# Patient Record
Sex: Male | Born: 2000 | Race: White | Hispanic: No | Marital: Single | Smoking: Never smoker
Health system: Southern US, Community
[De-identification: ages and names within clinical notes are randomized; demographics above are authoritative.]

## PROBLEM LIST (undated history)

## (undated) DIAGNOSIS — T7840XA Allergy, unspecified, initial encounter: Secondary | ICD-10-CM

## (undated) HISTORY — PX: FRACTURE SURGERY: SHX138

## (undated) HISTORY — DX: Allergy, unspecified, initial encounter: T78.40XA

---
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
## 2021-05-27 ENCOUNTER — Ambulatory Visit (HOSPITAL_BASED_OUTPATIENT_CLINIC_OR_DEPARTMENT_OTHER)
Admission: RE | Admit: 2021-05-27 | Discharge: 2021-05-27 | Disposition: A | Discharge status: Home / Self Care | Payer: 59 | Source: Ambulatory Visit | Attending: Family Medicine | Admitting: Family Medicine

## 2021-05-27 ENCOUNTER — Encounter: Payer: Self-pay | Admitting: Family Medicine

## 2021-05-27 ENCOUNTER — Ambulatory Visit (INDEPENDENT_AMBULATORY_CARE_PROVIDER_SITE_OTHER): Payer: 59 | Admitting: Family Medicine

## 2021-05-27 ENCOUNTER — Other Ambulatory Visit: Payer: Self-pay

## 2021-05-27 VITALS — BP 108/76 | HR 51 | Temp 98.6°F | Ht 60.0 in | Wt 114.2 lb

## 2021-05-27 DIAGNOSIS — M25531 Pain in right wrist: Secondary | ICD-10-CM | POA: Diagnosis present

## 2021-05-27 NOTE — Progress Notes (Signed)
Musculoskeletal Exam  Patient: Kenneth Stein DOB: 2001-09-20  DOS: 05/27/2021  SUBJECTIVE:  Chief Complaint:   Chief Complaint  Patient presents with   Wrist Pain    Right wrist pain for one week Injured playing basketball    Kenneth Stein is a 20 y.o.  male for evaluation and treatment of R wrist pain.   Onset:  1 week ago. Hyperextended wrist  Location: back of R wrist Character:  aching  Progression of issue:  is unchanged Associated symptoms: swelling Treatment: to date has been rest and ice.   Neurovascular symptoms: no  Past Medical History:  Diagnosis Date   Allergy     Objective: VITAL SIGNS: BP 108/76   Pulse (!) 51   Temp 98.6 F (37 C) (Oral)   Ht 5' (1.524 m)   Wt 114 lb 4 oz (51.8 kg)   SpO2 99%   BMI 22.31 kg/m  Constitutional: Well formed, well developed. No acute distress. Thorax & Lungs: No accessory muscle use Musculoskeletal: R wrist.   Normal active range of motion: yes.   Normal passive range of motion: yes Tenderness to palpation: yes over dorsum of wrist medially over pisiform and hamate Deformity: no Ecchymosis: no Neurologic: Normal sensory function in hands Psychiatric: Normal mood. Age appropriate judgment and insight. Alert & oriented x 3.    Assessment:  Right wrist pain - Plan: DG Wrist Complete Right  Plan: Stretches/exercises, heat, ice, Tylenol, ibuprofen. Wrist brace during work w 40 lb lifting restriction for 2 weeks for work.  F/u prn. The patient voiced understanding and agreement to the plan.   Jilda Roche Parsons, DO 05/27/21  2:31 PM

## 2021-05-27 NOTE — Patient Instructions (Addendum)
Ice/cold pack over area for 10-15 min twice daily.  OK to take Tylenol 1000 mg (2 extra strength tabs) or 975 mg (3 regular strength tabs) every 6 hours as needed.  Ibuprofen 400-600 mg (2-3 over the counter strength tabs) every 6 hours as needed for pain.  Wear the brace at night and when you are working.   Let us know if you need anything.  Wrist and Forearm Exercises Do exercises exactly as told by your health care provider and adjust them as directed. It is normal to feel mild stretching, pulling, tightness, or discomfort as you do these exercises, but you should stop right away if you feel sudden pain or your pain gets worse.   RANGE OF MOTION EXERCISES These exercises warm up your muscles and joints and improve the movement and flexibility of your injured wrist and forearm. These exercises also help to relieve pain, numbness, and tingling. These exercises are done using the muscles in your injured wrist and forearm. Exercise A: Wrist Flexion, Active With your fingers relaxed, bend your wrist forward as far as you can. Hold this position for 30 seconds. Repeat 2 times. Complete this exercise 3 times per week. Exercise B: Wrist Extension, Active With your fingers relaxed, bend your wrist backward as far as you can. Hold this position for 30 seconds. Repeat 2 times. Complete this exercise 3 times per week. Exercise C: Supination, Active  Stand or sit with your arms at your sides. Bend your left / right elbow to an "L" shape (90 degrees). Turn your palm upward until you feel a gentle stretch on the inside of your forearm. Hold this position for 30 seconds. Slowly return your palm to the starting position. Repeat 2 times. Complete this exercise 3 times per week. Exercise D: Pronation, Active  Stand or sit with your arms at your sides. Bend your left / right elbow to an "L" shape (90 degrees). Turn your palm downward until you feel a gentle stretch on the top of your forearm. Hold  this position for 30 seconds. Slowly return your palm to the starting position. Repeat 2 times. Complete this exercise once a day.  STRETCHING EXERCISES These exercises warm up your muscles and joints and improve the movement and flexibility of your injured wrist and forearm. These exercises also help to relieve pain, numbness, and tingling. These exercises are done using your healthy wrist and forearm to help stretch the muscles in your injured wrist and forearm. Exercise E: Wrist Flexion, Passive  Extend your left / right arm in front of you, relax your wrist, and point your fingers downward. Gently push on the back of your hand. Stop when you feel a gentle stretch on the top of your forearm. Hold this position for 30 seconds. Repeat 2 times. Complete this exercise 3 times per week. Exercise F: Wrist Extension, Passive  Extend your left / right arm in front of you and turn your palm upward. Gently pull your palm and fingertips back so your fingers point downward. You should feel a gentle stretch on the palm-side of your forearm. Hold this position for 30 seconds. Repeat 2 times. Complete this exercise 3 times per week. Exercise G: Forearm Rotation, Supination, Passive Sit with your left / right elbow bent to an "L" shape (90 degrees) with your forearm resting on a table. Keeping your upper body and shoulder still, use your other hand to rotate your forearm palm-up until you feel a gentle to moderate stretch. Hold this position  for 30 seconds. Slowly release the stretch and return to the starting position. Repeat 2 times. Complete this exercise 3 times per week. Exercise H: Forearm Rotation, Pronation, Passive Sit with your left / right elbow bent to an "L" shape (90 degrees) with your forearm resting on a table. Keeping your upper body and shoulder still, use your other hand to rotate your forearm palm-down until you feel a gentle to moderate stretch. Hold this position for 30  seconds. Slowly release the stretch and return to the starting position. Repeat 2 times. Complete this exercise 3 times per week.  STRENGTHENING EXERCISES These exercises build strength and endurance in your wrist and forearm. Endurance is the ability to use your muscles for a long time, even after they get tired. Exercise I: Wrist Flexors  Sit with your left / right forearm supported on a table and your hand resting palm-up over the edge of the table. Your elbow should be bent to an "L" shape (about 90 degrees) and be below the level of your shoulder. Hold a 3-5 lb weight in your left / right hand. Or, hold a rubber exercise band or tube in both hands, keeping your hands at the same level and hip distance apart. There should be a slight tension in the exercise band or tube. Slowly curl your hand up toward your forearm. Hold this position for 3 seconds. Slowly lower your hand back to the starting position. Repeat 2 times. Complete this exercise 3 times per week. Exercise J: Wrist Extensors  Sit with your left / right forearm supported on a table and your hand resting palm-down over the edge of the table. Your elbow should be bent to an "L" shape (about 90 degrees) and be below the level of your shoulder. Hold a 3-5 lb weight in your left / right hand. Or, hold a rubber exercise band or tube in both hands, keeping your hands at the same level and hip distance apart. There should be a slight tension in the exercise band or tube. Slowly curl your hand up toward your forearm. Hold this position for 3 seconds. Slowly lower your hand back to the starting position. Repeat 2 times. Complete this exercise 3 times per week. Exercise K: Forearm Rotation, Supination  Sit with your left / right forearm supported on a table and your hand resting palm-down. Your elbow should be at your side, bent to an "L" shape (about 90 degrees), and below the level of your shoulder. Keep your wrist stable and in a  neutral position throughout the exercise. Gently hold a lightweight hammer with your left / right hand. Without moving your elbow or wrist, slowly rotate your palm upward to a thumbs-up position. Hold this position for 3 seconds. Slowly return your forearm to the starting position. Repeat 2 times. Complete this exercise 3 times per week. Exercise L: Forearm Rotation, Pronation  Sit with your left / right forearm supported on a table and your hand resting palm-up. Your elbow should be at your side, bent to an "L" shape (about 90 degrees), and below the level of your shoulder. Keep your wrist stable. Do not allow it to move backward or forward during the exercise. Gently hold a lightweight hammer with your left / right hand. Without moving your elbow or wrist, slowly rotate your palm and hand upward to a thumbs-up position. Hold this position for 3 seconds. Slowly return your forearm to the starting position. Repeat 2 times. Complete this exercise 3 times per  week. Exercise M: Grip Strengthening  Hold one of these items in your left / right hand: play dough, therapy putty, a dense sponge, a stress ball, or a large, rolled sock. Squeeze as hard as you can without increasing pain. Hold this position for 5 seconds. Slowly release your grip. Repeat 2 times. Complete this exercise 3 times per week.  This information is not intended to replace advice given to you by your health care provider. Make sure you discuss any questions you have with your health care provider. Document Released: 07/29/2005 Document Revised: 06/08/2016 Document Reviewed: 06/09/2015 Elsevier Interactive Patient Education  [employer].

## 2021-06-12 ENCOUNTER — Telehealth: Payer: Self-pay | Admitting: Family Medicine

## 2021-06-12 NOTE — Telephone Encounter (Signed)
Patient called stating his sprained wrist (per his last visit 08/30) is crooked and he cannot keep it straight. He wants to know what he can do. Please advice.

## 2021-06-12 NOTE — Telephone Encounter (Signed)
It's crooked? Did he further injure it? Could refer to sports med. Please place referral if he is interested. Ty.

## 2021-06-13 ENCOUNTER — Other Ambulatory Visit: Payer: Self-pay | Admitting: Family Medicine

## 2021-06-13 DIAGNOSIS — M25531 Pain in right wrist: Secondary | ICD-10-CM

## 2021-06-13 NOTE — Telephone Encounter (Signed)
Referral done

## 2021-06-13 NOTE — Telephone Encounter (Signed)
Called left message to call back 

## 2021-06-13 NOTE — Telephone Encounter (Signed)
Called home/cell number left message to call back.

## 2021-06-13 NOTE — Telephone Encounter (Signed)
Spoke to the patient and he said he has not injured it and does not hurt/but he cannot straighten his arm A referral to either PT or sports medicine either is fine but refer where PCP thinks best.

## 2021-06-19 ENCOUNTER — Ambulatory Visit: Payer: Self-pay

## 2021-06-19 ENCOUNTER — Other Ambulatory Visit: Payer: Self-pay

## 2021-06-19 ENCOUNTER — Encounter: Payer: Self-pay | Admitting: Family Medicine

## 2021-06-19 ENCOUNTER — Ambulatory Visit (INDEPENDENT_AMBULATORY_CARE_PROVIDER_SITE_OTHER): Payer: 59 | Admitting: Family Medicine

## 2021-06-19 VITALS — Ht 60.0 in | Wt 118.0 lb

## 2021-06-19 DIAGNOSIS — S63591A Other specified sprain of right wrist, initial encounter: Secondary | ICD-10-CM

## 2021-06-19 DIAGNOSIS — M25531 Pain in right wrist: Secondary | ICD-10-CM

## 2021-06-19 MED ORDER — PREDNISONE 5 MG PO TABS: ORAL_TABLET | ORAL | 0 refills | Status: DC

## 2021-06-19 NOTE — Patient Instructions (Signed)
Nice to meet you Please try ice as needed  You can try the wraps  Please try the exercises   Please send me a message in MyChart with any questions or updates.  Please see me back in 3 weeks.   --Dr. Jordan Likes

## 2021-06-19 NOTE — Progress Notes (Signed)
  Kenneth Stein - 20 y.o. male MRN [id number]  Date of birth: 10-19-2000  SUBJECTIVE:  Including CC & ROS.  No chief complaint on file.   Kenneth Stein is a 20 y.o. male that is presenting with right hand pain.  He was playing basketball and had a hyperextension injury.  Since that time has had pain for the ulnar aspect of the wrist.  Independent review of the right wrist x-ray from 8/30 shows no acute changes.   Review of Systems See HPI   HISTORY: Past Medical, Surgical, Social, and Family History Reviewed & Updated per EMR.   Pertinent Historical Findings include:  Past Medical History:  Diagnosis Date   Allergy     Past Surgical History:  Procedure Laterality Date   FRACTURE SURGERY      Family History  Problem Relation Age of Onset   Hyperlipidemia Father     Social History   Socioeconomic History   Marital status: Single    Spouse name: Not on file   Number of children: Not on file   Years of education: Not on file   Highest education level: Not on file  Occupational History   Not on file  Tobacco Use   Smoking status: Never   Smokeless tobacco: Never  Substance and Sexual Activity   Alcohol use: No    Alcohol/week: 0.0 standard drinks   Drug use: No   Sexual activity: Not on file  Other Topics Concern   Not on file  Social History Narrative   Not on file   Social Determinants of Health   Financial Resource Strain: Not on file  Food Insecurity: Not on file  Transportation Needs: Not on file  Physical Activity: Not on file  Stress: Not on file  Social Connections: Not on file  Intimate Partner Violence: Not on file     PHYSICAL EXAM:  VS: Ht 5' (1.524 m)   Wt 118 lb (53.5 kg)   BMI 23.05 kg/m  Physical Exam Gen: NAD, alert, cooperative with exam, well-appearing   Limited ultrasound: Right wrist:  No effusion appreciated of the distal radius. Increased hyperemia distal to the distal ulna.  There does appear to be some fluid in this  area. No ECU changes. Normal-appearing dorsal compartments.   Summary: Findings consistent with DRUJ sprain   Ultrasound and interpretation by Clare Gandy, MD     ASSESSMENT & PLAN:   Sprain of right distal radioulnar joint Injury occurred on 8/23.  Appears to have change of the DRUJ with extension injury  -Counseled on home exercise therapy and supportive care. -Counseled on wrist wraps. -Prednisone. -Could consider therapy.

## 2021-06-20 DIAGNOSIS — S63591A Other specified sprain of right wrist, initial encounter: Secondary | ICD-10-CM | POA: Insufficient documentation

## 2021-06-20 NOTE — Assessment & Plan Note (Signed)
Injury occurred on 8/23.  Appears to have change of the DRUJ with extension injury  -Counseled on home exercise therapy and supportive care. -Counseled on wrist wraps. -Prednisone. -Could consider therapy.

## 2021-07-10 ENCOUNTER — Ambulatory Visit: Payer: 59 | Admitting: Family Medicine

## 2021-07-14 ENCOUNTER — Ambulatory Visit: Payer: 59 | Admitting: Family Medicine

## 2021-07-14 NOTE — Progress Notes (Deleted)
  Kenneth Stein - 20 y.o. male MRN [id number]  Date of birth: August 10, 2001  SUBJECTIVE:  Including CC & ROS.  No chief complaint on file.   Kenneth Stein is a 20 y.o. male that is  ***.  ***   Review of Systems See HPI   HISTORY: Past Medical, Surgical, Social, and Family History Reviewed & Updated per EMR.   Pertinent Historical Findings include:  Past Medical History:  Diagnosis Date   Allergy     Past Surgical History:  Procedure Laterality Date   FRACTURE SURGERY      Family History  Problem Relation Age of Onset   Hyperlipidemia Father     Social History   Socioeconomic History   Marital status: Single    Spouse name: Not on file   Number of children: Not on file   Years of education: Not on file   Highest education level: Not on file  Occupational History   Not on file  Tobacco Use   Smoking status: Never   Smokeless tobacco: Never  Substance and Sexual Activity   Alcohol use: No    Alcohol/week: 0.0 standard drinks   Drug use: No   Sexual activity: Not on file  Other Topics Concern   Not on file  Social History Narrative   Not on file   Social Determinants of Health   Financial Resource Strain: Not on file  Food Insecurity: Not on file  Transportation Needs: Not on file  Physical Activity: Not on file  Stress: Not on file  Social Connections: Not on file  Intimate Partner Violence: Not on file     PHYSICAL EXAM:  VS: There were no vitals taken for this visit. Physical Exam Gen: NAD, alert, cooperative with exam, well-appearing MSK:  ***      ASSESSMENT & PLAN:   No problem-specific Assessment & Plan notes found for this encounter.

## 2022-03-04 ENCOUNTER — Encounter: Payer: Self-pay | Admitting: Family Medicine

## 2022-03-04 ENCOUNTER — Ambulatory Visit: Payer: 59 | Admitting: Family Medicine

## 2022-03-04 VITALS — BP 110/78 | HR 73 | Temp 98.1°F | Ht 60.0 in | Wt 118.2 lb

## 2022-03-04 DIAGNOSIS — M25462 Effusion, left knee: Secondary | ICD-10-CM

## 2022-03-04 DIAGNOSIS — S76112A Strain of left quadriceps muscle, fascia and tendon, initial encounter: Secondary | ICD-10-CM

## 2022-03-04 MED ORDER — PREDNISONE 20 MG PO TABS: 40.0000 mg | ORAL_TABLET | Freq: Every day | ORAL | 0 refills | Status: AC

## 2022-03-04 NOTE — Progress Notes (Signed)
Musculoskeletal Exam  Patient: Kenneth Stein DOB: 18-Aug-2001  DOS: 03/04/2022  SUBJECTIVE:  Chief Complaint:   Chief Complaint  Patient presents with   Knee Pain    Left     Kenneth Stein is a 21 y.o.  male for evaluation and treatment of L knee pain.   Onset:  5 days ago. No inj or change in activity.  Location: medial knee Character:  aching and sharp  Progression of issue:  is unchanged Associated symptoms: swelling No redness, bruising, catching, locking, fevers Treatment: to date has been ice and elevation.   Neurovascular symptoms: no  Past Medical History:  Diagnosis Date   Allergy     Objective: VITAL SIGNS: BP 110/78   Pulse 73   Temp 98.1 F (36.7 C) (Oral)   Ht 5' (1.524 m)   Wt 118 lb 4 oz (53.6 kg)   SpO2 99%   BMI 23.09 kg/m  Constitutional: Well formed, well developed. No acute distress. Thorax & Lungs: No accessory muscle use Musculoskeletal: L knee.   Normal active range of motion: Decreased flexion, normal extension Normal passive range of motion: Decreased flexion, normal extension Tenderness to palpation: yes over the lVL (no ttp over VM) and middle belly overall of quad Deformity: edema noted over VM distally and a small effusion of the knee joint Ecchymosis: no Pain elicited w resisted knee extension Tests positive: Stinchfield Tests negative: Lachman's, varus/valgus stress, patellar apprehension/grind,, equivocal McMurray's due to poor range of motion, negative Stines Neurologic: Normal sensory function. No focal deficits noted.  Antalgic gait Psychiatric: Normal mood. Age appropriate judgment and insight. Alert & oriented x 3.    Assessment:  Effusion of left knee - Plan: Sedimentation rate, Uric acid  Strain of left quadriceps muscle, initial encounter - Plan: Sedimentation rate, CBC  Plan: Stretches/exercises with quad, heat, ice, Tylenol, elevation, 5-day prednisone burst 40 mg daily.  We will check labs to rule out gout and  possible pyomyositis. F/u prn. The patient voiced understanding and agreement to the plan.   [name], DO 03/04/22  4:20 PM

## 2022-03-04 NOTE — Patient Instructions (Signed)
Give [place] 2-3 business days to get the results of your labs back.   Ice/cold pack over area for 10-15 min twice daily.  OK to take Tylenol 1000 mg (2 extra strength tabs) or 975 mg (3 regular strength tabs) every 6 hours as needed.  Let us know if you need anything.  Quadriceps Strain Rehab It is normal to feel mild stretching, pulling, tightness, or discomfort as you do these exercises, but you should stop right away if you feel sudden pain or your pain gets worse. Stretching and range of motion exercises These exercises warm up your muscles and joints and improve the movement and flexibility of your thigh. These exercises can also help to relieve stiffness or swelling. Exercise A: Heel slides    Lie on your back with both knees straight. If this causes back discomfort, bend the knee of your healthy leg, placing your foot flat on the floor. Slowly slide your left / right heel back toward your buttocks until you feel a gentle stretch in the front of your knee or thigh. Hold for 30 seconds. Then slowly slide your heel back to the starting position. Repeat 2 times. Complete this exercise 3 times a week. Exercise B: Quadriceps stretch, prone    Lie on your abdomen on a firm surface, such as a bed or padded floor. Bend your left / right knee and hold your ankle. If you cannot reach your ankle or pant leg, loop a belt around your foot and grab the belt instead. Gently pull your heel toward your buttocks. Your knee should not slide out to the side. You should feel a stretch in the front of your thigh and knee. Hold this position for 30 seconds. Repeat 2 times. Complete this exercise 3 times a week. Strengthening exercises These exercises build strength and endurance in your thigh. Endurance is the ability to use your muscles for a long time, even after your muscles get tired. Exercise C: Straight leg raises (quadriceps and hip flexors) Quality counts! Watch for signs that the quadriceps muscle  is working to ensure that you are strengthening the correct muscles and not cheating by using healthier muscles. Lie on your back with your left / right leg extended and your other knee bent. Tense the muscles in the front of your left / right thigh. You should see your kneecap slide up or see increased dimpling just above the knee. Tighten these muscles even more and raise your leg 4-6 inches (10-15 cm) off the floor. Hold for 3 seconds. Keep the thigh muscles tense as you lower your leg. Relax the muscles slowly and completely after each repetition. Repeat 2 times. Complete this exercise 3 times a week. Exercise D: Straight leg raises (hip extensors) Lie on your belly on a bed or a firm surface with a pillow under your hips. Bend your left / right knee so your foot is straight up in the air. Tense your buttock muscles and lift your left / right thigh off the bed. Do not let your back arch. Hold this position for 3 seconds. Slowly return to the starting position. Let your muscles relax completely before doing another repetition. Repeat 2 times. Complete this exercise 3 times a week. Exercise E: Wall sits    Follow the directions for form closely. If you do not place your feet and knees properly, this can lead to knee pain. Lean back against a smooth wall or door and walk your feet out 18-24 inches (46-61 cm) from  it. Place your feet hip-width apart. Slowly slide down the wall or door until your knees bend  60-90 degrees. Keep your weight back and over your heels, not over your toes. Keep your thighs straight or pointing slightly outward. Hold for 1 second. Use your thigh and buttock muscles to push you back up to a standing position. Keep your weight through your heels while you do this. Rest for 5 seconds in between repetitions. Repeat 2 times. Complete this exercise 3 times a week. Make sure you discuss any questions you have with your health care provider. Document Released: 09/14/2005  Document Revised: 05/21/2016 Document Reviewed: 06/18/2015 Elsevier Interactive Patient Education  [employer].

## 2022-03-05 LAB — CBC
HCT: 43.4 % (ref 39.0–52.0)
Hemoglobin: 14.7 g/dL (ref 13.0–17.0)
MCHC: 33.8 g/dL (ref 30.0–36.0)
MCV: 89.7 fl (ref 78.0–100.0)
Platelets: 378 10*3/uL (ref 150.0–400.0)
RBC: 4.84 Mil/uL (ref 4.22–5.81)
RDW: 13.1 % (ref 11.5–14.6)
WBC: 11 10*3/uL — ABNORMAL HIGH (ref 4.5–10.5)

## 2022-03-05 LAB — SEDIMENTATION RATE: Sed Rate: 16 mm/hr — ABNORMAL HIGH (ref 0–15)

## 2022-03-05 LAB — URIC ACID: Uric Acid, Serum: 5.3 mg/dL (ref 4.0–7.8)

## 2022-03-11 ENCOUNTER — Emergency Department (HOSPITAL_COMMUNITY)
Admission: EM | Admit: 2022-03-11 | Discharge: 2022-03-11 | Disposition: A | Discharge status: Home / Self Care | Payer: 59 | Attending: Emergency Medicine | Admitting: Emergency Medicine

## 2022-03-11 ENCOUNTER — Emergency Department (HOSPITAL_COMMUNITY): Payer: 59

## 2022-03-11 ENCOUNTER — Other Ambulatory Visit: Payer: Self-pay

## 2022-03-11 ENCOUNTER — Encounter (HOSPITAL_COMMUNITY): Payer: Self-pay

## 2022-03-11 DIAGNOSIS — M1990 Unspecified osteoarthritis, unspecified site: Secondary | ICD-10-CM

## 2022-03-11 DIAGNOSIS — M064 Inflammatory polyarthropathy: Secondary | ICD-10-CM | POA: Insufficient documentation

## 2022-03-11 DIAGNOSIS — M1380 Other specified arthritis, unspecified site: Secondary | ICD-10-CM

## 2022-03-11 DIAGNOSIS — H9202 Otalgia, left ear: Secondary | ICD-10-CM | POA: Diagnosis not present

## 2022-03-11 DIAGNOSIS — M25562 Pain in left knee: Secondary | ICD-10-CM | POA: Diagnosis present

## 2022-03-11 LAB — SYNOVIAL CELL COUNT + DIFF, W/ CRYSTALS
Crystals, Fluid: NONE SEEN
Eosinophils-Synovial: 0 % (ref 0–1)
Lymphocytes-Synovial Fld: 30 % — ABNORMAL HIGH (ref 0–20)
Monocyte-Macrophage-Synovial Fluid: 3 % — ABNORMAL LOW (ref 50–90)
Neutrophil, Synovial: 67 % — ABNORMAL HIGH (ref 0–25)
WBC, Synovial: 10875 /mm3 — ABNORMAL HIGH (ref 0–200)

## 2022-03-11 MED ORDER — ONDANSETRON 4 MG PO TBDP
ORAL_TABLET | ORAL | Status: AC
  Administered 2022-03-11: 4 mg via ORAL
  Filled 2022-03-11: qty 1

## 2022-03-11 MED ORDER — OXYCODONE-ACETAMINOPHEN 5-325 MG PO TABS
1.0000 | ORAL_TABLET | Freq: Once | ORAL | Status: AC
  Administered 2022-03-11: 1 via ORAL
  Filled 2022-03-11: qty 1

## 2022-03-11 MED ORDER — ONDANSETRON 4 MG PO TBDP: 4.0000 mg | ORAL_TABLET | Freq: Once | ORAL | Status: AC

## 2022-03-11 MED ORDER — LIDOCAINE-EPINEPHRINE (PF) 2 %-1:200000 IJ SOLN
10.0000 mL | Freq: Once | INTRAMUSCULAR | Status: AC
  Administered 2022-03-11: 10 mL via INTRADERMAL
  Filled 2022-03-11: qty 20

## 2022-03-11 NOTE — ED Triage Notes (Signed)
Pt having left knee pain for two weeks and has been on steroid from pcp for 5 days with no improvement. Pt ambulatory to triage. 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

## 2022-03-11 NOTE — ED Notes (Signed)
Patient Alert and oriented to baseline. Stable and ambulatory to baseline. Patient verbalized understanding of the discharge instructions.  Patient belongings were taken by the patient.   

## 2022-03-11 NOTE — ED Notes (Signed)
Pt c/o nausea, vomiting after taking pain medication. Verbal order from [facility] MD for ODT Zofran

## 2022-03-11 NOTE — Discharge Instructions (Signed)
Continue ibuprofen every 6 hours.  Utilize Ace wrap or knee sleeve for comfort.  Continue to put weight on your left leg within the tolerance of your pain.  Avoid activities that worsen your pain.  Follow-up with your primary care doctor as needed and return to the emergency department at any time for any new or worsening symptoms.

## 2022-03-11 NOTE — ED Provider Notes (Addendum)
[facility] EMERGENCY DEPARTMENT Provider Note   CSN: [id number] Arrival date & time: 03/11/22  7829     History  Chief Complaint  Patient presents with   Knee Pain    Kenneth Stein is a 21 y.o. male.   Knee Pain Patient presents for knee pain.  Pain has been present for the past 2 weeks.  At the time of onset, he was quite active.  He will play basketball often.  He developed soreness to his left knee.  Approximately 1 week ago, he was at work when he felt a sensation of his knee locking up.  This caused worsening pain.  For work, patient does Patent examiner.  The worsening pain did prompt him to go to his primary care doctor.  His PCP check lab work and patient was given 5 days of a steroid course.  He does not feel that this has helped his pain.  He has been trying to elevate his knee at home.  He feels that the pain worsens when he props it up on the pillows.  Pain became more severe overnight.  He has more difficult time putting weight on his knee today.  He did take 3 tablets of ibuprofen this morning which did help.  He has not been utilizing any brace or crutches.  Prior to the onset of his symptoms, patient did develop URI symptoms.  He thinks this may have been from weather and environment changes following a trip to [place].  He was treated for left ear otitis.  He has had improved left ear pain since that time. He denies any recent symptoms of STI.  Since his URI, he has not had any systemic symptoms.  He currently endorses knee pain only.     Home Medications Prior to Admission medications   Medication Sig Start Date End Date Taking? Authorizing Provider  acetaminophen (TYLENOL) 500 MG tablet Take 1,000 mg by mouth every 6 (six) hours as needed for mild pain.   Yes [provider]  ibuprofen (ADVIL) 200 MG tablet Take 400 mg by mouth every 6 (six) hours as needed for mild pain.   Yes [provider]  ofloxacin (FLOXIN) 0.3 % OTIC solution  5 drops 2 (two) times daily. 02/27/22  Yes [provider]  amoxicillin-clavulanate (AUGMENTIN) 875-125 MG tablet Take 1 tablet by mouth 2 (two) times daily. Patient not taking: Reported on 03/11/2022 02/27/22   [provider]  predniSONE (DELTASONE) 20 MG tablet Take 20 mg by mouth 2 (two) times daily. 5 day course. Finished 03/09/22 Patient not taking: Reported on 03/11/2022    [provider]      Allergies    Patient has no known allergies.    Review of Systems   Review of Systems  HENT:  Positive for ear pain (Left ear, resolving).   Musculoskeletal:  Positive for arthralgias and joint swelling.  All other systems reviewed and are negative.   Physical Exam Updated Vital Signs BP 126/61   Pulse 66   Temp 98.5 F (36.9 C) (Oral)   Resp 16   Ht 5' (1.524 m)   Wt 53.6 kg   SpO2 95%   BMI 23.08 kg/m  Physical Exam Vitals and nursing note reviewed.  Constitutional:      General: He is not in acute distress.    Appearance: Normal appearance. He is well-developed and normal weight. He is not ill-appearing, toxic-appearing or diaphoretic.  HENT:     Head:  Normocephalic and atraumatic.     Right Ear: Tympanic membrane, ear canal and external ear normal.     Left Ear: External ear normal.     Ears:     Comments: Resolving bullous myringitis of left TM    Nose: Nose normal.     Mouth/Throat:     Mouth: Mucous membranes are moist.     Pharynx: Oropharynx is clear.  Eyes:     Extraocular Movements: Extraocular movements intact.     Conjunctiva/sclera: Conjunctivae normal.  Cardiovascular:     Rate and Rhythm: Normal rate.  Pulmonary:     Effort: Pulmonary effort is normal. No respiratory distress.  Abdominal:     General: There is no distension.     Palpations: Abdomen is soft.  Musculoskeletal:        General: Swelling and tenderness present. No deformity.     Cervical back: Normal range of motion and neck supple.     Right lower leg: No edema.      Left lower leg: No edema.  Skin:    General: Skin is warm and dry.     Capillary Refill: Capillary refill takes less than 2 seconds.     Coloration: Skin is not jaundiced or pale.     Findings: No erythema.  Neurological:     General: No focal deficit present.     Mental Status: He is alert and oriented to person, place, and time.     Cranial Nerves: No cranial nerve deficit.     Sensory: No sensory deficit.     Motor: No weakness.     Coordination: Coordination normal.  Psychiatric:        Mood and Affect: Mood normal.        Behavior: Behavior normal.        Thought Content: Thought content normal.        Judgment: Judgment normal.     ED Results / Procedures / Treatments   Labs (all labs ordered are listed, but only abnormal results are displayed) Labs Reviewed  SYNOVIAL CELL COUNT + DIFF, W/ CRYSTALS - Abnormal; Notable for the following components:      Result Value   Appearance-Synovial CLOUDY (*)    WBC, Synovial 10,875 (*)    Neutrophil, Synovial 67 (*)    Lymphocytes-Synovial Fld 30 (*)    Monocyte-Macrophage-Synovial Fluid 3 (*)    All other components within normal limits  BODY FLUID CULTURE W GRAM STAIN  GLUCOSE, BODY FLUID OTHER              EKG None  Radiology DG Knee Complete 4 Views Left  Result Date: 03/11/2022 CLINICAL DATA:  21 year old male with history of left knee pain. EXAM: LEFT KNEE - COMPLETE 4+ VIEW COMPARISON:  No priors. FINDINGS: No evidence of fracture, dislocation, or joint effusion. No evidence of arthropathy or other focal bone abnormality. Soft tissues are unremarkable. IMPRESSION: Negative. Electronically Signed   By: Trudie Reedaniel  Entrikin M.D.   On: 03/11/2022 07:39    Procedures .Joint Aspiration/Arthrocentesis  Date/Time: 03/11/2022 10:58 AM  Performed by: Gloris Manchesterixon, Kabrina Christiano, MD Authorized by: Gloris Manchesterixon, Denee Boeder, MD   Consent:    Consent obtained:  Verbal   Consent given by:  Patient   Risks, benefits, and alternatives were discussed: yes      Risks discussed:  Bleeding, infection and pain   Alternatives discussed:  No treatment and delayed treatment Universal protocol:    Procedure explained and questions answered to patient or proxy's  satisfaction: yes     Imaging studies available: yes     Patient identity confirmed:  Verbally with patient Location:    Location:  Knee Anesthesia:    Anesthesia method:  Local infiltration   Local anesthetic:  Lidocaine 2% WITH epi Procedure details:    Preparation: Patient was prepped and draped in usual sterile fashion     Needle gauge:  18 G   Ultrasound guidance: yes     Approach:  Superior   Aspirate amount:  10cc   Aspirate characteristics:  Yellow   Steroid injected: no   Post-procedure details:    Dressing:  Adhesive bandage and gauze roll   Procedure completion:  Tolerated     Medications Ordered in ED Medications  oxyCODONE-acetaminophen (PERCOCET/ROXICET) 5-325 MG per tablet 1 tablet (1 tablet Oral Given 03/11/22 1039)  lidocaine-EPINEPHrine (XYLOCAINE W/EPI) 2 %-1:200000 (PF) injection 10 mL (10 mLs Intradermal Given 03/11/22 1039)  ondansetron (ZOFRAN-ODT) disintegrating tablet 4 mg (4 mg Oral Given 03/11/22 1209)    ED Course/ Medical Decision Making/ A&P                           Medical Decision Making Amount and/or Complexity of Data Reviewed Labs: ordered.  Risk Prescription drug management.   This patient presents to the ED for concern of left knee pain, this involves an extensive number of treatment options, and is a complaint that carries with it a high risk of complications and morbidity.  The differential diagnosis includes soft tissue injury, occult fracture, inflammatory arthritis, gout, septic joint, reactive streptococcal arthritis   Co morbidities that complicate the patient evaluation  N/A   Additional history obtained:  Additional history obtained from N/A External records from outside source obtained and reviewed including EMR   Lab  Tests:  I Ordered, and personally interpreted labs.  The pertinent results include: No crystals and 10,875 WBCs on synovial fluid analysis, consistent with inflammatory arthritis   Imaging Studies ordered:  I ordered imaging studies including knee x-ray I independently visualized and interpreted imaging which showed no osseous or joint abnormalities I agree with the radiologist interpretation   Problem List / ED Course / Critical interventions / Medication management  Patient is a healthy 21 year old male presenting for left knee pain.  This pain has been ongoing for the past 2 weeks.  He believes it was initially due to overuse injury from being very active and playing basketball.  1 week ago, he had a worsening while at work due to a abnormal twisting of his knee.  He underwent 5-day steroid course without any relief.  Pain worsened overnight and for this reason, patient presents to the ED.  On arrival, vital signs are normal.  Patient is well-appearing.  Left knee shows mild swelling and tenderness that is greatest in the quadriceps tendon region.  Knee is mildly warm to touch when compared to right knee.  Patient is active range of motion is limited by pain.  Passive range of motion is intact.  Patient is able to bear very light weight on his left knee.  He did take ibuprofen this morning.  Patient consented for arthrocentesis.  Percocet was given for additional analgesia.  Patient does have history of vasovagal syncope with blood draws.  During arthrocentesis, he did have a syncopal episode with vasovagal prodrome.  Syncope lasted for 5 seconds.  He returned to baseline quickly thereafter.  Otherwise, he tolerated procedure well.  He was  given ice water to drink.  He was kept in his chair.  Synovial fluid was sent for testing.  Results of synovial fluid testing are consistent with inflammatory arthritis.  Patient was provided Ace wrap as well as knee sleeve.  He declined crutches.  He was advised  to weight-bear as tolerated and to continue ibuprofen every 6 hours.  He was discharged in good condition. I ordered medication including Percocet for analgesia Reevaluation of the patient after these medicines showed that the patient improved I have reviewed the patients home medicines and have made adjustments as needed   Social Determinants of Health:  Has PCP        Final Clinical Impression(s) / ED Diagnoses Final diagnoses:  Inflammatory arthritis    Rx / DC Orders ED Discharge Orders     None         Gloris Manchester, MD 03/11/22 1334    Gloris Manchester, MD 03/11/22 1335

## 2022-03-12 LAB — GLUCOSE, BODY FLUID OTHER: Glucose, Body Fluid Other: 43 mg/dL

## 2022-03-14 LAB — BODY FLUID CULTURE W GRAM STAIN: Culture: NO GROWTH

## 2022-12-10 ENCOUNTER — Ambulatory Visit: Payer: 59 | Admitting: Family Medicine

## 2022-12-14 ENCOUNTER — Ambulatory Visit: Payer: No Typology Code available for payment source | Admitting: Family Medicine

## 2022-12-14 ENCOUNTER — Encounter: Payer: Self-pay | Admitting: Family Medicine

## 2022-12-14 VITALS — BP 108/60 | HR 69 | Temp 98.8°F | Ht 61.0 in | Wt 115.4 lb

## 2022-12-14 DIAGNOSIS — J020 Streptococcal pharyngitis: Secondary | ICD-10-CM | POA: Diagnosis not present

## 2022-12-14 LAB — POCT RAPID STREP A (OFFICE): Rapid Strep A Screen: POSITIVE — AB

## 2022-12-14 MED ORDER — AMOXICILLIN 500 MG PO CAPS: 1000.0000 mg | ORAL_CAPSULE | Freq: Every day | ORAL | 0 refills | Status: AC

## 2022-12-14 NOTE — Progress Notes (Signed)
Chief Complaint  Patient presents with   Sore Throat    Spot on finger to check     Kenneth Stein here for URI complaints.  Duration: 2 days  Associated symptoms: sore throat Denies: sinus congestion, sinus pain, rhinorrhea, itchy watery eyes, ear pain, ear drainage, wheezing, shortness of breath, myalgia, coughing, and fevers Treatment to date: Tylenol, ibuprofen Sick contacts: No  Past Medical History:  Diagnosis Date   Allergy     Objective BP 108/60 (BP Location: Left Arm, Patient Position: Sitting, Cuff Size: Normal)   Pulse 69   Temp 98.8 F (37.1 C) (Oral)   Ht 5\' 1"  (1.549 m)   Wt 115 lb 6 oz (52.3 kg)   SpO2 99%   BMI 21.80 kg/m  General: Awake, alert, appears stated age HEENT: AT, [place], ears patent b/l and TM's neg, nares patent w/o discharge, pharynx erythematous and without exudates, MMM Neck: No masses or asymmetry, tender cerv LN's b/l Heart: RRR Lungs: CTAB, no accessory muscle use Psych: Age appropriate judgment and insight, normal mood and affect  Strep throat - Plan: POCT rapid strep A, amoxicillin (AMOXIL) 500 MG capsule  2/4 Centor criteria. Strep test +. 10 d of amox. Ibuprofen/Tylenol. Continue to push fluids, practice good hand hygiene, cover mouth when coughing. F/u prn. If starting to experience fevers, shaking, or shortness of breath, seek immediate care. Pt voiced understanding and agreement to the plan.  [place], DO 12/14/22 3:38 PM

## 2022-12-14 NOTE — Patient Instructions (Signed)
Consider throat lozenges, salt water gargles and an air humidifier for symptomatic care.   Replace your toothbrush/head after 24 hrs on antibiotics.  OK to take Tylenol 1000 mg (2 extra strength tabs) or 975 mg (3 regular strength tabs) every 6 hours as needed.  Ibuprofen 400-600 mg (2-3 over the counter strength tabs) every 6 hours as needed for pain.  Let us know if you need anything.

## 2023-01-13 ENCOUNTER — Encounter: Payer: Self-pay | Admitting: *Deleted

## 2023-04-12 ENCOUNTER — Encounter: Payer: Self-pay | Admitting: Family Medicine

## 2023-04-12 ENCOUNTER — Ambulatory Visit: Payer: No Typology Code available for payment source | Admitting: Family Medicine

## 2023-04-12 VITALS — BP 120/80 | HR 51 | Temp 98.5°F | Ht 61.0 in | Wt 116.5 lb

## 2023-04-12 DIAGNOSIS — L255 Unspecified contact dermatitis due to plants, except food: Secondary | ICD-10-CM

## 2023-04-12 MED ORDER — PREDNISONE 20 MG PO TABS: ORAL_TABLET | ORAL | 0 refills | Status: DC

## 2023-04-12 NOTE — Patient Instructions (Addendum)
Try not to scratch as this can make things worse. Avoid scented products while dealing with this. You may resume when the itchiness resolves. Cold/cool compresses can help.   Claritin (loratadine), Allegra (fexofenadine), Zyrtec (cetirizine) which is also equivalent to Xyzal (levocetirizine); these are listed in order from weakest to strongest. Generic, and therefore cheaper, options are in the parentheses.   There are available OTC, and the generic versions, which may be cheaper, are in parentheses. Show this to a pharmacist if you have trouble finding any of these items.  Let us know if you need anything.

## 2023-04-12 NOTE — Progress Notes (Signed)
Chief Complaint  Patient presents with   Poison Ivy    Spero Gunnels is a 22 y.o. male here for a poison ivy exposure.  Duration: 4 days Location: legs, forearms Pruritic? Yes Painful? No Drainage? No +exposure to poison ivy Other associated symptoms: feels it is spreading, no fevers Therapies tried thus far: OTC topicals  Past Medical History:  Diagnosis Date   Allergy     BP 120/80 (BP Location: Left Arm, Patient Position: Sitting, Cuff Size: Normal)   Pulse (!) 51   Temp 98.5 F (36.9 C) (Oral)   Ht 5\' 1"  (1.549 m)   Wt 116 lb 8 oz (52.8 kg)   SpO2 99%   BMI 22.01 kg/m  Gen: awake, alert, appearing stated age Lungs: No accessory muscle use Skin: See below. No drainage, excessive warmth, ttp Psych: Age appropriate judgment and insight         Rhus dermatitis - Plan: predniSONE (DELTASONE) 20 MG tablet  3 week taper of 60 mg/d for 1 weeks, 40 mg/d for 1 week and 20 mg/d for final week. Try not to scratch. Avoid scented products. Consider PO antihist.  F/u prn. The patient voiced understanding and agreement to the plan.  Jilda Roche [place], DO 04/12/23 11:57 AM

## 2023-04-13 ENCOUNTER — Telehealth: Payer: Self-pay | Admitting: Family Medicine

## 2023-04-13 ENCOUNTER — Ambulatory Visit (INDEPENDENT_AMBULATORY_CARE_PROVIDER_SITE_OTHER): Payer: No Typology Code available for payment source

## 2023-04-13 DIAGNOSIS — L255 Unspecified contact dermatitis due to plants, except food: Secondary | ICD-10-CM | POA: Diagnosis not present

## 2023-04-13 MED ORDER — METHYLPREDNISOLONE ACETATE 80 MG/ML IJ SUSP
80.0000 mg | Freq: Once | INTRAMUSCULAR | Status: AC
  Administered 2023-04-13: 80 mg via INTRAMUSCULAR

## 2023-04-13 MED ORDER — CALAMINE EX LOTN: 1.0000 | TOPICAL_LOTION | CUTANEOUS | Status: DC | PRN

## 2023-04-13 NOTE — Progress Notes (Signed)
Kenneth Stein is a 22 y.o. male presents to the office today for Depo 80 mg injections, per physician's orders. Original order: 04/13/23: "Could come in for a depomedrol shot but if he started medicine yesterday, it probably just needs some more time for it to kick in." Depo 80 mg ,  IM was administered R Ventral gluteal today. Patient tolerated injection. Patient due for follow up labs/provider appt: No.  Patient next injection due: n/a  Pt asked what he could use topically for his sxs:  Advised OTC Calamine lotion as additional topical tx for rash.  Pt aware and voices understanding.   [place], Feliberto Harts

## 2023-04-13 NOTE — Telephone Encounter (Signed)
Pt called stating his Poison [place] is getting worse and the medication is not helping. Please Advise.

## 2023-04-13 NOTE — Telephone Encounter (Signed)
Could come in for a depomedrol shot but if he started medicine yesterday, it probably just needs some more time for it to kick in.

## 2023-04-13 NOTE — Telephone Encounter (Signed)
Called and scheduled NV today.

## 2023-04-20 ENCOUNTER — Emergency Department (HOSPITAL_BASED_OUTPATIENT_CLINIC_OR_DEPARTMENT_OTHER)
Admission: EM | Admit: 2023-04-20 | Discharge: 2023-04-20 | Disposition: A | Discharge status: Home / Self Care | Payer: 59 | Attending: Emergency Medicine | Admitting: Emergency Medicine

## 2023-04-20 ENCOUNTER — Emergency Department (HOSPITAL_BASED_OUTPATIENT_CLINIC_OR_DEPARTMENT_OTHER): Payer: 59

## 2023-04-20 ENCOUNTER — Encounter (HOSPITAL_BASED_OUTPATIENT_CLINIC_OR_DEPARTMENT_OTHER): Payer: Self-pay | Admitting: Emergency Medicine

## 2023-04-20 ENCOUNTER — Other Ambulatory Visit: Payer: Self-pay

## 2023-04-20 DIAGNOSIS — S022XXA Fracture of nasal bones, initial encounter for closed fracture: Secondary | ICD-10-CM | POA: Diagnosis not present

## 2023-04-20 DIAGNOSIS — W2105XA Struck by basketball, initial encounter: Secondary | ICD-10-CM | POA: Insufficient documentation

## 2023-04-20 DIAGNOSIS — Y9367 Activity, basketball: Secondary | ICD-10-CM | POA: Insufficient documentation

## 2023-04-20 DIAGNOSIS — S0993XA Unspecified injury of face, initial encounter: Secondary | ICD-10-CM

## 2023-04-20 DIAGNOSIS — R220 Localized swelling, mass and lump, head: Secondary | ICD-10-CM | POA: Diagnosis not present

## 2023-04-20 DIAGNOSIS — S0992XA Unspecified injury of nose, initial encounter: Secondary | ICD-10-CM | POA: Diagnosis present

## 2023-04-20 MED ORDER — IBUPROFEN 800 MG PO TABS
800.0000 mg | ORAL_TABLET | Freq: Once | ORAL | Status: AC
  Administered 2023-04-20: 800 mg via ORAL
  Filled 2023-04-20: qty 1

## 2023-04-20 NOTE — ED Provider Notes (Signed)
[place] [facility] Provider Note   CSN: [id number] Arrival date & time: 04/20/23  2056    History  Nose injury   Kenneth Stein is a 22 y.o. male with no significant past medical history here for evaluation of injury.  Plain basketball earlier today, his head hit his nose.  Had a nosebleed which subsequently stopped.  No LOC, anticoagulation.  Noted pain, swelling deformity to nasal bridge.  No history of similar.  HPI     Home Medications Prior to Admission medications   Medication Sig Start Date End Date Taking? Authorizing Provider  acetaminophen (TYLENOL) 500 MG tablet Take 1,000 mg by mouth every 6 (six) hours as needed for mild pain.    [provider]  calamine lotion Apply 1 Application topically as needed for itching. 04/13/23   Sharlene Dory, DO  ibuprofen (ADVIL) 200 MG tablet Take 400 mg by mouth every 6 (six) hours as needed for mild pain.    [provider]  predniSONE (DELTASONE) 20 MG tablet 3 tabs daily for 1 week then 2 tabs daily for 1 week and then 1 tab daily. 04/12/23   Sharlene Dory, DO      Allergies    Patient has no known allergies.    Review of Systems   Review of Systems  Constitutional: Negative.   HENT:  Positive for facial swelling (nose) and nosebleeds.   Respiratory: Negative.    Cardiovascular: Negative.   Gastrointestinal: Negative.   Genitourinary: Negative.   Musculoskeletal: Negative.   Skin: Negative.   Neurological: Negative.   All other systems reviewed and are negative.   Physical Exam Updated Vital Signs BP 117/75 (BP Location: Left Arm)   Pulse 75   Temp 98 F (36.7 C)   Resp 18   Ht 5\' 1"  (1.549 m)   Wt 53.1 kg   SpO2 94%   BMI 22.11 kg/m  Physical Exam Vitals and nursing note reviewed.  Constitutional:      General: He is not in acute distress.    Appearance: He is well-developed. He is not ill-appearing, toxic-appearing or diaphoretic.   HENT:     Head: Atraumatic.     Nose: Nasal deformity, signs of injury and nasal tenderness present.     Right Nostril: Epistaxis present. No foreign body, septal hematoma or occlusion.     Left Nostril: Epistaxis present. No foreign body, septal hematoma or occlusion.      Comments: Tenderness over nasal bridge with deformity.  Old epistaxis bilateral naris, no active bleeding.  No septal hematoma Eyes:     Pupils: Pupils are equal, round, and reactive to light.  Cardiovascular:     Rate and Rhythm: Normal rate and regular rhythm.  Pulmonary:     Effort: Pulmonary effort is normal. No respiratory distress.  Abdominal:     General: There is no distension.     Palpations: Abdomen is soft.  Musculoskeletal:        General: Normal range of motion.     Cervical back: Normal range of motion and neck supple.  Skin:    General: Skin is warm and dry.  Neurological:     General: No focal deficit present.     Mental Status: He is alert and oriented to person, place, and time.    ED Results / Procedures / Treatments   Labs (all labs ordered are listed, but only abnormal results are displayed) Labs Reviewed - No data to  display  EKG None  Radiology CT Maxillofacial Wo Contrast  Result Date: 04/20/2023 CLINICAL DATA:  Blunt facial trauma. Patient reports someone ran into his face during basketball. Injuring his nose. Bruising and swelling to the nose. EXAM: CT MAXILLOFACIAL WITHOUT CONTRAST TECHNIQUE: Multidetector CT imaging of the maxillofacial structures was performed. Multiplanar CT image reconstructions were also generated. RADIATION DOSE REDUCTION: This exam was performed according to the departmental dose-optimization program which includes automated exposure control, adjustment of the mA and/or kV according to patient size and/or use of iterative reconstruction technique. COMPARISON:  None Available. FINDINGS: Osseous: There is mildly depressed fracture of the right nasal bone  (series 9, image 41). Remaining osseous structures are within normal limits. Orbits: Negative. No traumatic or inflammatory finding. Sinuses: Clear. Soft tissues: Mild soft tissue swelling about the nasal bridge. No fluid collection or hematoma. Limited intracranial: No significant or unexpected finding. IMPRESSION: Mildly depressed fracture of the right nasal bone. Electronically Signed   By: Larose Hires D.O.   On: 04/20/2023 23:12    Procedures Procedures    Medications Ordered in ED Medications  ibuprofen (ADVIL) tablet 800 mg (800 mg Oral Given 04/20/23 2239)    ED Course/ Medical Decision Making/ A&P   22 year old here for evaluation of nose injury after playing basketball, his head hit his nose.  He has had pain, swelling and deformity to nasal bridge.  Initially had epistaxis which resolved.  On exam no active bleeding.  He does have some tenderness no septal hematoma.  Will plan on imaging.  Imaging personally viewed and interpreted:  CT shows nasal fracture  Discussed symptomatic management, will have him follow-up outpatient, return for new or worsening symptoms.  Tylenol Motrin, ice to face.  Afrin as needed for epistaxis  The patient has been appropriately medically screened and/or stabilized in the ED. I have low suspicion for any other emergent medical condition which would require further screening, evaluation or treatment in the ED or require inpatient management.  Patient is hemodynamically stable and in no acute distress.  Patient able to ambulate in department prior to ED.  Evaluation does not show acute pathology that would require ongoing or additional emergent interventions while in the emergency department or further inpatient treatment.  I have discussed the diagnosis with the patient and answered all questions.  Pain is been managed while in the emergency department and patient has no further complaints prior to discharge.  Patient is comfortable with plan discussed in  room and is stable for discharge at this time.  I have discussed strict return precautions for returning to the emergency department.  Patient was encouraged to follow-up with PCP/specialist refer to at discharge.                              Medical Decision Making Amount and/or Complexity of Data Reviewed Radiology: ordered and independent interpretation performed. Decision-making details documented in ED Course.  Risk OTC drugs. Prescription drug management. Diagnosis or treatment significantly limited by social determinants of health.         Final Clinical Impression(s) / ED Diagnoses Final diagnoses:  Facial injury, initial encounter  Closed fracture of nasal bone, initial encounter    Rx / DC Orders ED Discharge Orders     None         Yahir Tavano A, PA-C 04/20/23 2319    Terrilee Files, MD 04/21/23 1057

## 2023-04-20 NOTE — ED Triage Notes (Signed)
Patient arrives 30 minutes after playing basketball stating another player ran into his face and feels like his nose may be broken. States this caused the nose to bleed however bleeding stopped. No LOC, no other head trauma. Nose appears to be bruised and swollen. Aox4.

## 2023-04-20 NOTE — Discharge Instructions (Addendum)
It was a pleasure taking care of you here in the emergency department today  Your CT scan showed  a broken nose  May take Tylenol, Motrin as needed for pain, ice to the nose.  Follow-up outpatient   Return for new or worsening symptoms such as severe headache, weakness, persistent nosebleed if you do have a nosebleed may use Afrin, 2 squirts in each nares.  Hold pressure.  If does not stop after 1 hour seek reevaluation

## 2023-04-21 ENCOUNTER — Encounter: Payer: Self-pay | Admitting: Family

## 2023-04-21 ENCOUNTER — Ambulatory Visit: Payer: No Typology Code available for payment source | Admitting: Family

## 2023-04-21 VITALS — BP 128/84 | HR 53 | Ht 61.0 in | Wt 114.0 lb

## 2023-04-21 DIAGNOSIS — S022XXA Fracture of nasal bones, initial encounter for closed fracture: Secondary | ICD-10-CM | POA: Diagnosis not present

## 2023-04-21 NOTE — Progress Notes (Signed)
Subjective:     Patient ID: Kenneth Stein, male    DOB: 2000/12/05, 22 y.o.   MRN: [id number]  Chief Complaint  Patient presents with   Follow-up    Hospital follow up  Pt broke his nose  Would like a referral to make sure nose heals properly     HPI  Discussed the use of AI scribe software for clinical note transcription with the patient, who gave verbal consent to proceed.  History of Present Illness          Patient had a nose injury yesterday playing basketball and presented to the ED.  CT noted mildly depressed fracture of the right nasal bone. He reports breathing without difficulty through both nares, but he is concerned about the nasal deformity and making sure that it "doesn't grow back wrong."     Health Maintenance Due  Topic Date Due   COVID-19 Vaccine (1) Never done   HIV Screening  Never done   Hepatitis C Screening  Never done    Past Medical History:  Diagnosis Date   Allergy     Past Surgical History:  Procedure Laterality Date   FRACTURE SURGERY      Family History  Problem Relation Age of Onset   Hyperlipidemia Father     Social History   Socioeconomic History   Marital status: Single    Spouse name: Not on file   Number of children: Not on file   Years of education: Not on file   Highest education level: Not on file  Occupational History   Not on file  Tobacco Use   Smoking status: Never   Smokeless tobacco: Never  Substance and Sexual Activity   Alcohol use: No    Alcohol/week: 0.0 standard drinks of alcohol   Drug use: No   Sexual activity: Not on file  Other Topics Concern   Not on file  Social History Narrative   Not on file   Social Determinants of Health   Financial Resource Strain: Not on file  Food Insecurity: Not on file  Transportation Needs: Not on file  Physical Activity: Not on file  Stress: Not on file  Social Connections: Unknown (02/06/2022)   Received from [facility], [facility]   Social Network     Social Network: Not on file  Intimate Partner Violence: Unknown (12/29/2021)   Received from [facility], [facility]   HITS    Physically Hurt: Not on file    Insult or Talk Down To: Not on file    Threaten Physical Harm: Not on file    Scream or Curse: Not on file    Outpatient Medications Prior to Visit  Medication Sig Dispense Refill   acetaminophen (TYLENOL) 500 MG tablet Take 1,000 mg by mouth every 6 (six) hours as needed for mild pain.     calamine lotion Apply 1 Application topically as needed for itching.     ibuprofen (ADVIL) 200 MG tablet Take 400 mg by mouth every 6 (six) hours as needed for mild pain.     predniSONE (DELTASONE) 20 MG tablet 3 tabs daily for 1 week then 2 tabs daily for 1 week and then 1 tab daily. 42 tablet 0   No facility-administered medications prior to visit.    No Known Allergies  ROS See HPI    Objective:    Physical Exam Constitutional:      Appearance: Normal appearance.  HENT:     Head: Normocephalic and  atraumatic.     Nose: Nasal deformity and nasal tenderness present.     Comments: Swelling across nasal bridge R>L  Patent nares bilaterally Neurological:     General: No focal deficit present.     Mental Status: He is alert and oriented to person, place, and time.  Psychiatric:        Mood and Affect: Mood normal.        Behavior: Behavior normal.        Thought Content: Thought content normal.        Judgment: Judgment normal.      BP 128/84 (BP Location: Left Arm, Patient Position: Sitting, Cuff Size: Normal)   Pulse (!) 53   Ht 5\' 1"  (1.549 m)   Wt 114 lb (51.7 kg)   SpO2 100%   BMI 21.54 kg/m  Wt Readings from Last 3 Encounters:  04/21/23 114 lb (51.7 kg)  04/20/23 117 lb (53.1 kg)  04/12/23 116 lb 8 oz (52.8 kg)       Assessment & Plan:   Problem List Items Addressed This Visit       Unprioritized   Closed fracture of nasal bones - Primary    New. Will refer to ENT urgently to see if they can reset  the deformity. Pt verbalizes understanding.       Relevant Orders   Ambulatory referral to ENT    I am having [name] maintain his acetaminophen, ibuprofen, predniSONE, and calamine.  No orders of the defined types were placed in this encounter.

## 2023-04-21 NOTE — Assessment & Plan Note (Signed)
New. Will refer to ENT urgently to see if they can reset the deformity. Pt verbalizes understanding.

## 2023-04-28 DIAGNOSIS — S022XXA Fracture of nasal bones, initial encounter for closed fracture: Secondary | ICD-10-CM | POA: Diagnosis not present

## 2023-04-28 DIAGNOSIS — M950 Acquired deformity of nose: Secondary | ICD-10-CM | POA: Diagnosis not present

## 2023-09-17 ENCOUNTER — Ambulatory Visit: Payer: 59 | Admitting: Medical

## 2024-01-23 ENCOUNTER — Encounter (HOSPITAL_BASED_OUTPATIENT_CLINIC_OR_DEPARTMENT_OTHER): Payer: Self-pay | Admitting: Emergency Medicine

## 2024-01-23 ENCOUNTER — Emergency Department (HOSPITAL_BASED_OUTPATIENT_CLINIC_OR_DEPARTMENT_OTHER)
Admission: EM | Admit: 2024-01-23 | Discharge: 2024-01-23 | Disposition: A | Discharge status: Home / Self Care | Payer: Self-pay | Attending: Emergency Medicine | Admitting: Emergency Medicine

## 2024-01-23 ENCOUNTER — Other Ambulatory Visit: Payer: Self-pay

## 2024-01-23 DIAGNOSIS — Y9367 Activity, basketball: Secondary | ICD-10-CM | POA: Insufficient documentation

## 2024-01-23 DIAGNOSIS — S0181XA Laceration without foreign body of other part of head, initial encounter: Secondary | ICD-10-CM | POA: Insufficient documentation

## 2024-01-23 DIAGNOSIS — Y92310 Basketball court as the place of occurrence of the external cause: Secondary | ICD-10-CM | POA: Insufficient documentation

## 2024-01-23 DIAGNOSIS — X58XXXA Exposure to other specified factors, initial encounter: Secondary | ICD-10-CM | POA: Insufficient documentation

## 2024-01-23 MED ORDER — IBUPROFEN 400 MG PO TABS
600.0000 mg | ORAL_TABLET | Freq: Once | ORAL | Status: AC
  Administered 2024-01-23: 600 mg via ORAL
  Filled 2024-01-23: qty 1

## 2024-01-23 MED ORDER — LIDOCAINE-EPINEPHRINE-TETRACAINE (LET) TOPICAL GEL
3.0000 mL | Freq: Once | TOPICAL | Status: AC
  Administered 2024-01-23: 3 mL via TOPICAL
  Filled 2024-01-23: qty 3

## 2024-01-23 MED ORDER — LIDOCAINE HCL (PF) 1 % IJ SOLN
10.0000 mL | Freq: Once | INTRAMUSCULAR | Status: AC
  Administered 2024-01-23: 10 mL
  Filled 2024-01-23: qty 10

## 2024-01-23 NOTE — ED Notes (Signed)
 1 inch lac to eyelid, bleeding controlled.

## 2024-01-23 NOTE — ED Provider Notes (Signed)
 [place] [facility] Provider Note   CSN: [id number] Arrival date & time: 01/23/24  1201     History {Add pertinent medical, surgical, social history, OB history to HPI:1} Chief Complaint  Patient presents with   Eye Injury    Kenneth Stein is a 23 y.o. male presenting department complaining of a laceration and injury above his left eye.  This occurred with his playing basketball and he caught an elbow and then had an abrasion or scrape or cut above his left eye.  He does not wear contacts he denies vision changes.  Tetanus up-to-date  HPI     Home Medications Prior to Admission medications   Medication Sig Start Date End Date Taking? Authorizing Provider  acetaminophen  (TYLENOL ) 500 MG tablet Take 1,000 mg by mouth every 6 (six) hours as needed for mild pain.    [provider]  calamine lotion Apply 1 Application topically as needed for itching. 04/13/23   Jobe Mulder, DO  ibuprofen  (ADVIL ) 200 MG tablet Take 400 mg by mouth every 6 (six) hours as needed for mild pain.    [provider]  predniSONE  (DELTASONE ) 20 MG tablet 3 tabs daily for 1 week then 2 tabs daily for 1 week and then 1 tab daily. 04/12/23   Jobe Mulder, DO      Allergies    Patient has no known allergies.    Review of Systems   Review of Systems  Physical Exam Updated Vital Signs BP 126/71 (BP Location: Right Arm)   Pulse (!) 54   Temp 98.5 F (36.9 C) (Oral)   Resp 16   Ht 5\' 1"  (1.549 m)   Wt 53.1 kg   SpO2 96%   BMI 22.11 kg/m  Physical Exam Constitutional:      General: He is not in acute distress. HENT:     Head: Normocephalic.      Comments: 3 cm linear laceration above the left eyelid, below eyebrow, small gaping Eyes:     Extraocular Movements: Extraocular movements intact.     Conjunctiva/sclera: Conjunctivae normal.     Pupils: Pupils are equal, round, and reactive to light.  Cardiovascular:     Rate  and Rhythm: Normal rate and regular rhythm.  Pulmonary:     Effort: Pulmonary effort is normal. No respiratory distress.  Skin:    General: Skin is warm and dry.  Neurological:     General: No focal deficit present.     Mental Status: He is alert. Mental status is at baseline.  Psychiatric:        Mood and Affect: Mood normal.        Behavior: Behavior normal.     ED Results / Procedures / Treatments   Labs (all labs ordered are listed, but only abnormal results are displayed) Labs Reviewed - No data to display  EKG None  Radiology No results found.  Procedures Procedures  {Document cardiac monitor, telemetry assessment procedure when appropriate:1}  Medications Ordered in ED Medications  ibuprofen  (ADVIL ) tablet 600 mg (has no administration in time range)  lidocaine -EPINEPHrine -tetracaine (LET) topical gel (has no administration in time range)    ED Course/ Medical Decision Making/ A&P   {   Click here for ABCD2, HEART and other calculatorsREFRESH Note before signing :1}  Medical Decision Making  Isolated laceration above the left eyelid, does not cross the edge of the eyelid.  No vision loss or change, doubt iritis or lens dislocation of the eye.  No indication for CT imaging of the head, relatively low impact and doubt ICH.  Motrin  was given for pain, let applied to the wound after irrigation.  The wound was closed with laceration repair.  {Document critical care time when appropriate:1} {Document review of labs and clinical decision tools ie heart score, Chads2Vasc2 etc:1}  {Document your independent review of radiology images, and any outside records:1} {Document your discussion with family members, caretakers, and with consultants:1} {Document social determinants of health affecting pt's care:1} {Document your decision making why or why not admission, treatments were needed:1} Final Clinical Impression(s) / ED Diagnoses Final  diagnoses:  None    Rx / DC Orders ED Discharge Orders     None

## 2024-01-23 NOTE — ED Triage Notes (Signed)
 Pt reports he took an elbow to the LT eye playing basketball just PTA; laceration noted to eyelid

## 2024-01-23 NOTE — Discharge Instructions (Signed)
 You had 5 dissolving stitches placed in your forehead cut.  Please keep the stitches dry for the next 48 hours.  After that you can shower normally.  The stitches will naturally dissolve and fall onto their own over the next 2 weeks.  Try to keep this wound covered up and avoid direct sunlight for the next 30 days, to help reduce the risk of scar formation.  You do not need to put any ointment or peroxide or any other substances on the wound.  You can clean it with regular soap and water at home.

## 2024-02-17 IMAGING — CR DG KNEE COMPLETE 4+V*L*
4 series · 4 of 4 positions shown · non-contrast
Comparison: No priors.

CLINICAL DATA: 21-year-old male with history of left knee pain.

EXAM:
LEFT KNEE - COMPLETE 4+ VIEW

[knee ap]
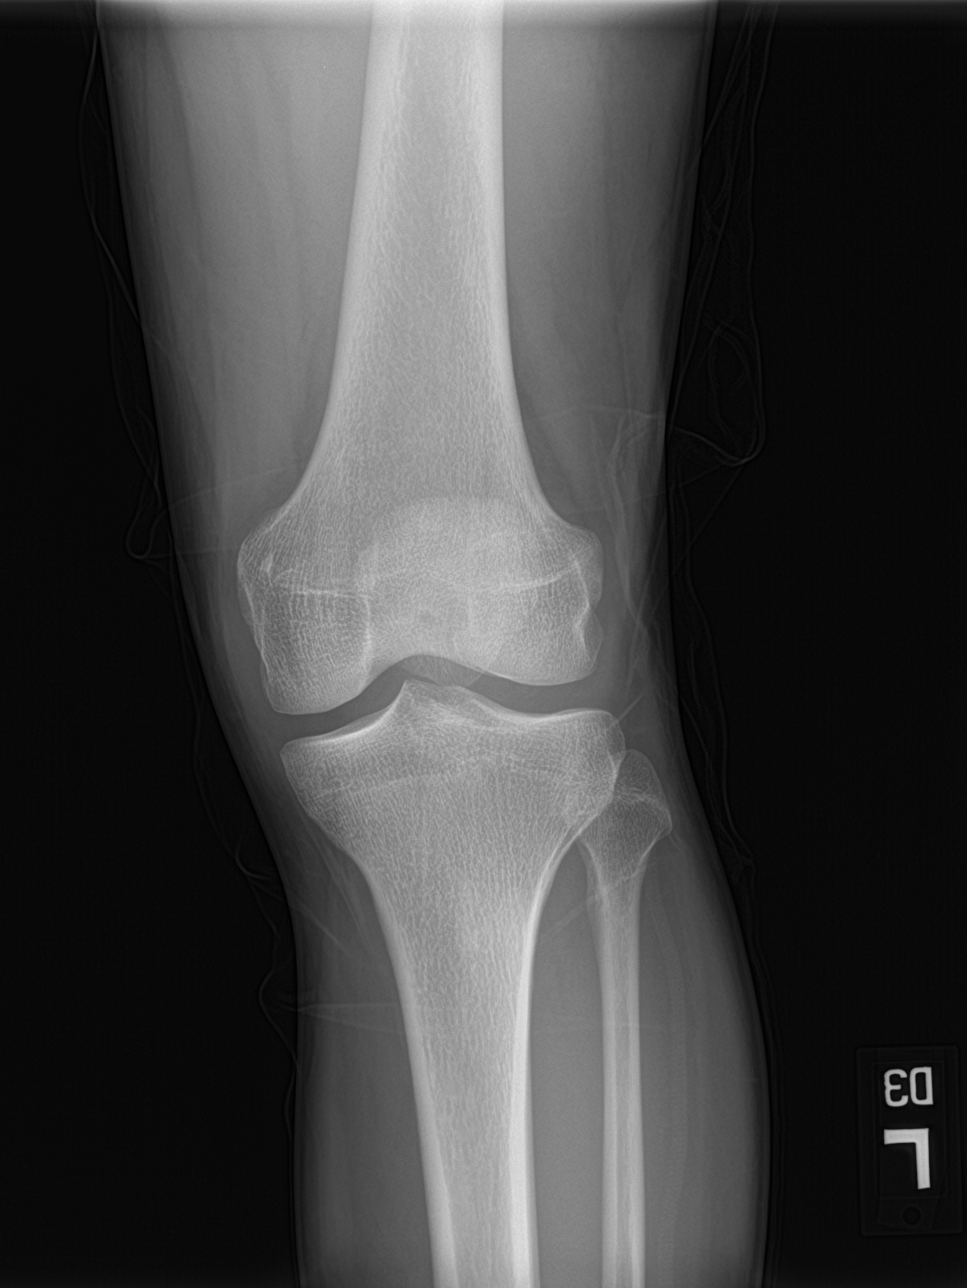

[knee lat]
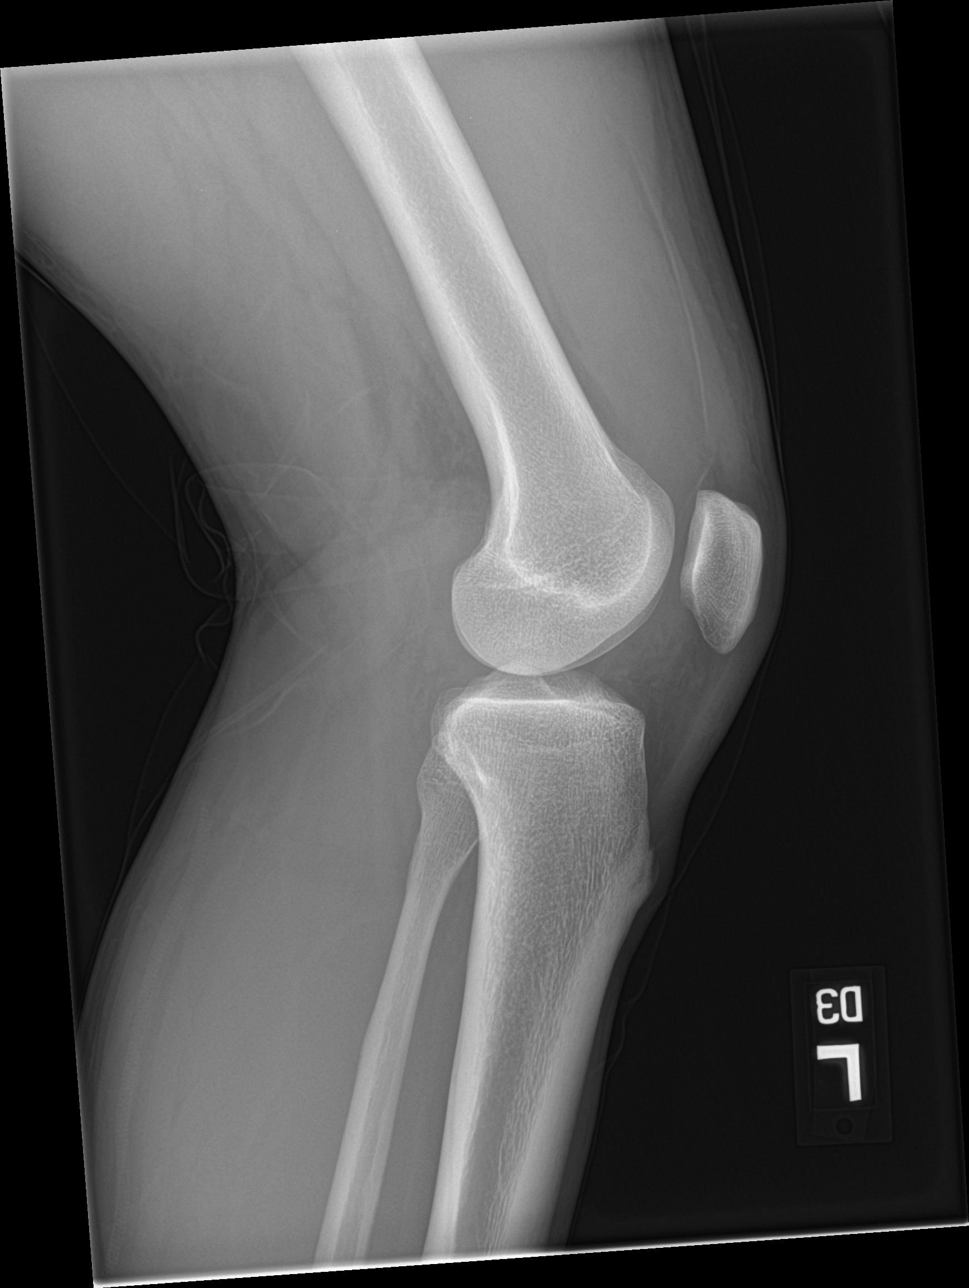

[knee obl (1 of 2)]
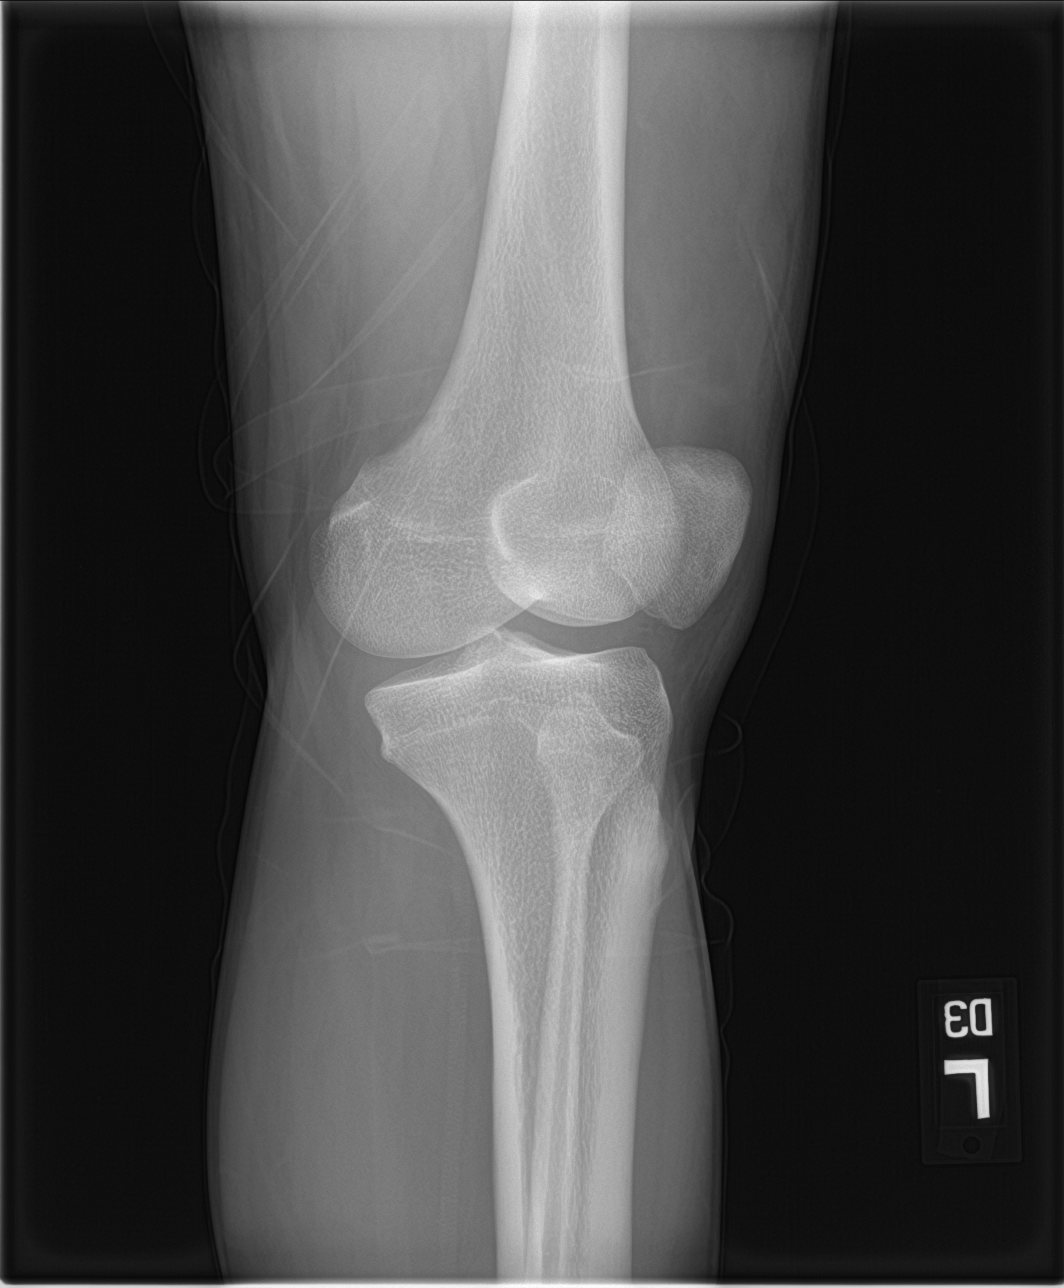

[knee obl (2 of 2)]
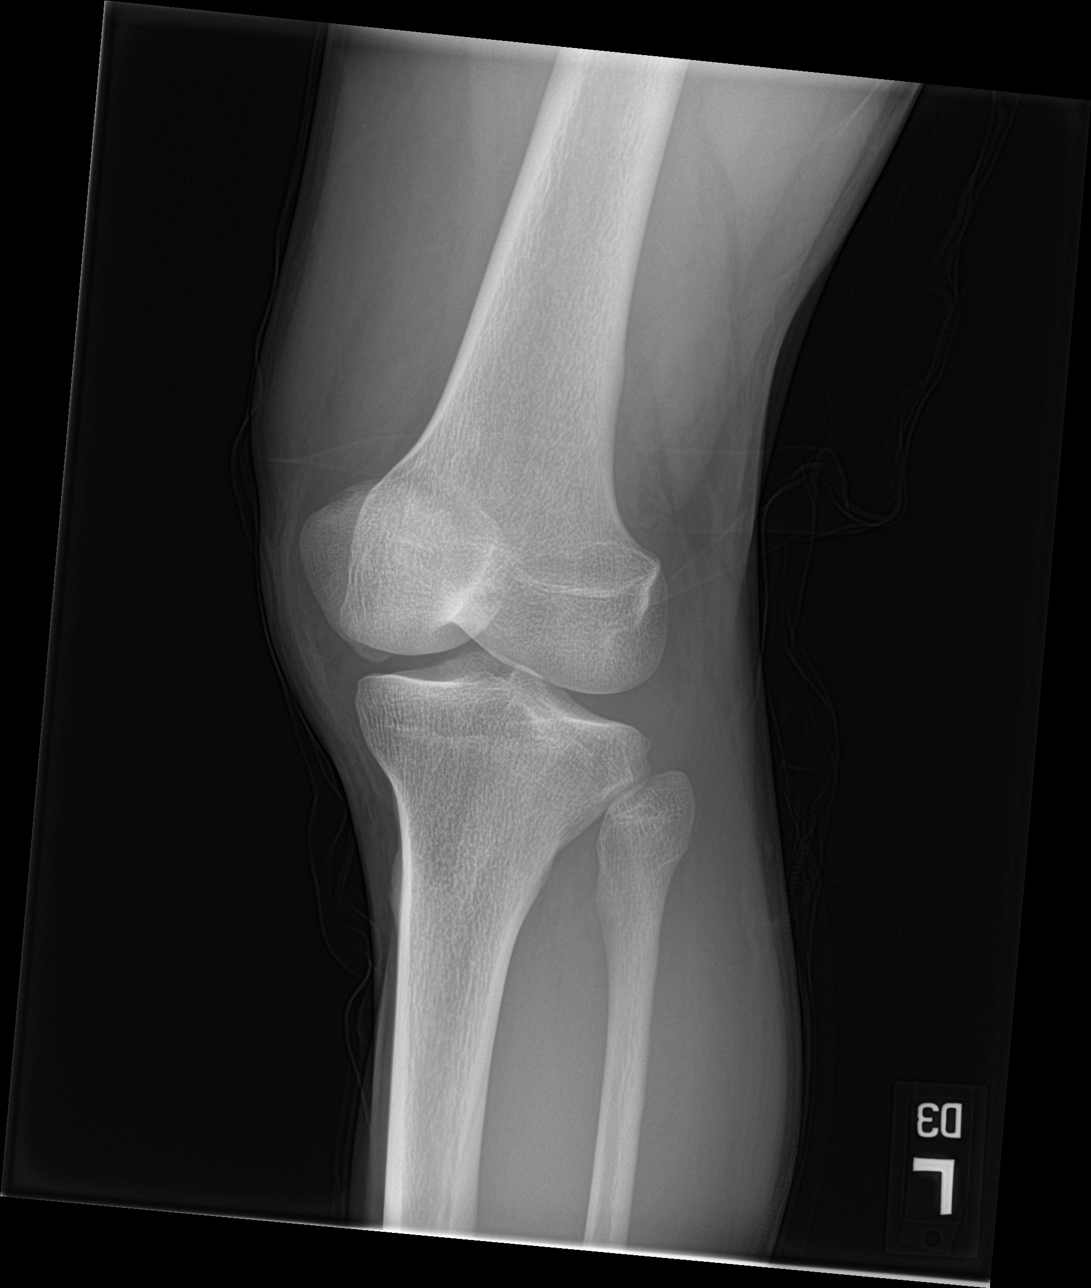

[4 of 4 positions shown; findings below may reference images not displayed]

FINDINGS: No evidence of fracture, dislocation, or joint effusion. No evidence
of arthropathy or other focal bone abnormality. Soft tissues are
unremarkable.
IMPRESSION: Negative.

## 2024-06-10 ENCOUNTER — Other Ambulatory Visit: Payer: Self-pay

## 2024-06-10 ENCOUNTER — Encounter (HOSPITAL_COMMUNITY): Payer: Self-pay

## 2024-06-10 ENCOUNTER — Emergency Department (HOSPITAL_COMMUNITY)
Admission: EM | Admit: 2024-06-10 | Discharge: 2024-06-10 | Disposition: A | Discharge status: Home / Self Care | Payer: Self-pay | Attending: Emergency Medicine | Admitting: Emergency Medicine

## 2024-06-10 DIAGNOSIS — S0502XA Injury of conjunctiva and corneal abrasion without foreign body, left eye, initial encounter: Secondary | ICD-10-CM | POA: Insufficient documentation

## 2024-06-10 DIAGNOSIS — S0501XA Injury of conjunctiva and corneal abrasion without foreign body, right eye, initial encounter: Secondary | ICD-10-CM | POA: Insufficient documentation

## 2024-06-10 DIAGNOSIS — S0500XA Injury of conjunctiva and corneal abrasion without foreign body, unspecified eye, initial encounter: Secondary | ICD-10-CM

## 2024-06-10 DIAGNOSIS — W458XXA Other foreign body or object entering through skin, initial encounter: Secondary | ICD-10-CM | POA: Insufficient documentation

## 2024-06-10 MED ORDER — FLUORESCEIN SODIUM 1 MG OP STRP
1.0000 | ORAL_STRIP | Freq: Once | OPHTHALMIC | Status: AC
  Administered 2024-06-10: 1 via OPHTHALMIC
  Filled 2024-06-10: qty 1

## 2024-06-10 MED ORDER — TETRACAINE HCL 0.5 % OP SOLN
2.0000 [drp] | Freq: Once | OPHTHALMIC | Status: AC
  Administered 2024-06-10: 2 [drp] via OPHTHALMIC
  Filled 2024-06-10: qty 4

## 2024-06-10 MED ORDER — TOBRAMYCIN 0.3 % OP SOLN
1.0000 [drp] | Freq: Once | OPHTHALMIC | Status: AC
Start: 2024-06-10 — End: 2024-06-10
  Administered 2024-06-10: 1 [drp] via OPHTHALMIC
  Filled 2024-06-10: qty 5

## 2024-06-10 NOTE — ED Provider Notes (Signed)
 [place] [facility] Provider Note   CSN: [id number] Arrival date & time: 06/10/24  0151     Patient presents with: Eye Pain   Kenneth Stein is a 23 y.o. male.   23 year old male with complaint of bilateral eye pain after exposure to hookah smoke tonight.  Patient originally with some irritation in his eyes, went home and went to bed however when he woke up the pain was more significant.  Reports vision to be blurry.  No other complaints or concerns, does not wear glasses or contacts.       Prior to Admission medications   Medication Sig Start Date End Date Taking? Authorizing Provider  acetaminophen  (TYLENOL ) 500 MG tablet Take 1,000 mg by mouth every 6 (six) hours as needed for mild pain.    [provider]  calamine lotion Apply 1 Application topically as needed for itching. 04/13/23   Frann Mabel Mt, DO  ibuprofen  (ADVIL ) 200 MG tablet Take 400 mg by mouth every 6 (six) hours as needed for mild pain.    [provider]  predniSONE  (DELTASONE ) 20 MG tablet 3 tabs daily for 1 week then 2 tabs daily for 1 week and then 1 tab daily. 04/12/23   Frann Mabel Mt, DO    Allergies: Patient has no known allergies.    Review of Systems Negative except as per HPI Updated Vital Signs BP 118/68 (BP Location: Left Arm)   Pulse 60   Temp (!) 97.5 F (36.4 C)   Resp 18   Ht 5' (1.524 m)   Wt 53.1 kg   SpO2 100%   BMI 22.85 kg/m   Physical Exam Vitals and nursing note reviewed.  Constitutional:      General: He is not in acute distress.    Appearance: He is well-developed. He is not diaphoretic.  HENT:     Head: Normocephalic and atraumatic.  Eyes:     General: Lids are normal.        Right eye: No foreign body or hordeolum.        Left eye: No foreign body or hordeolum.     Conjunctiva/sclera:     Right eye: Right conjunctiva is injected. No chemosis.    Left eye: Left conjunctiva is injected. No  chemosis.    Pupils:     Right eye: Corneal abrasion and fluorescein  uptake present. Seidel exam negative.     Left eye: Corneal abrasion and fluorescein  uptake present. Seidel exam negative.    Slit lamp exam:    Right eye: Anterior chamber quiet. Photophobia present.     Left eye: Photophobia present.     Comments:  + photophobia, eyes watery   Pulmonary:     Effort: Pulmonary effort is normal.  Neurological:     Mental Status: He is alert and oriented to person, place, and time.  Psychiatric:        Behavior: Behavior normal.     (all labs ordered are listed, but only abnormal results are displayed) Labs Reviewed - No data to display  EKG: None  Radiology: No results found.   Procedures   Medications Ordered in the ED - No data to display                                  Medical Decision Making Risk Prescription drug management.   23 year old male presents with complaint of bilateral  eye pain, photophobia after exposure to hookah smoke tonight.  Eyes were irrigated with saline And symptoms began to improve.  Symptoms resolve with tetracaine  drops.  Does have fluorescein  stain uptake to both eyes without streaming.  Patient is provided with tobramycin  drops and referral to ophthalmology.  Recommend continued use of preservative-free eyedrops.     Final diagnoses:  None    ED Discharge Orders     None          Beverley Leita LABOR, PA-C 06/10/24 0439    Franklyn Sid SAILOR, MD 06/10/24 [phone]

## 2024-06-10 NOTE — ED Notes (Addendum)
 The patient's eyes have been irrigated with one liter bag of normal saline. The patient reports is has helped and he is now able to tolerate opening his eyes but reports both of his eyes still hurt.

## 2024-06-10 NOTE — Discharge Instructions (Signed)
 Apply antibiotic drops to both eyes every 4 hours while awake. Use a preservative-free eyedrop during the day to keep eyes moisturized. Follow-up with ophthalmology, call Monday morning to schedule an appointment.

## 2024-06-10 NOTE — ED Triage Notes (Signed)
 Pt arrive POV with family member c/o bilateral eye irritation that started today. Pt states he is not able to open his eyes due to pain. He have his eyes irrigated with no relief. Denies any eye injury.

## 2024-07-10 ENCOUNTER — Encounter: Payer: Self-pay | Admitting: Family Medicine

## 2024-09-13 ENCOUNTER — Encounter: Payer: Self-pay | Admitting: Family Medicine

## 2024-09-13 ENCOUNTER — Ambulatory Visit: Admitting: Family Medicine

## 2024-09-13 VITALS — BP 118/64 | HR 64 | Temp 98.0°F | Resp 16 | Ht 61.0 in | Wt 119.4 lb

## 2024-09-13 DIAGNOSIS — R519 Headache, unspecified: Secondary | ICD-10-CM

## 2024-09-13 MED ORDER — KETOROLAC TROMETHAMINE 60 MG/2ML IM SOLN
60.0000 mg | Freq: Once | INTRAMUSCULAR | Status: AC
  Administered 2024-09-13: 15:00:00 60 mg via INTRAMUSCULAR

## 2024-09-13 MED ORDER — SUMATRIPTAN SUCCINATE 100 MG PO TABS: 100.0000 mg | ORAL_TABLET | ORAL | 0 refills | Status: AC | PRN

## 2024-09-13 NOTE — Patient Instructions (Signed)
 Let me know if there are issues with the new medicine.  OK to take Tylenol  1000 mg (2 extra strength tabs) or 975 mg (3 regular strength tabs) every 6 hours as needed.  No ibuprofen  for the next 6 hours.   Let us  know if you need anything.

## 2024-09-13 NOTE — Progress Notes (Signed)
 Chief Complaint  Patient presents with   Headache    Headaches      Kenneth Stein is a 23 y.o. male here for evaluation of an acute headache.  Duration: 2 weeks Laterality: left frontal Quality: aching, sharp No radiation Severity: 7/10 Associated symptoms: sensitivity to light No sound sensitivity, N/V, neuro s/s's Therapies tried: ibuprofen  Hx of migraines: none Dad has a history of migraines.  Past Medical History:  Diagnosis Date   Allergy       Allergies as of 09/13/2024   No Known Allergies      Medication List        Accurate as of September 13, 2024  3:26 PM. If you have any questions, ask your nurse or doctor.          STOP taking these medications    acetaminophen  500 MG tablet Commonly known as: TYLENOL  Stopped by: Mabel Pry, DO   calamine lotion Stopped by: Mabel Pry, DO   predniSONE  20 MG tablet Commonly known as: DELTASONE  Stopped by: Mabel Pry, DO       TAKE these medications    ibuprofen  200 MG tablet Commonly known as: ADVIL  Take 400 mg by mouth every 6 (six) hours as needed for mild pain.   SUMAtriptan  100 MG tablet Commonly known as: Imitrex  Take 1 tablet (100 mg total) by mouth every 2 (two) hours as needed for migraine. May repeat in 2 hours if headache persists or recurs. Started by: Mabel Pry, DO        BP 118/64 (BP Location: Left Arm, Patient Position: Sitting)   Pulse 64   Temp 98 F (36.7 C) (Oral)   Resp 16   Ht 5' 1 (1.549 m)   Wt 119 lb 6.4 oz (54.2 kg)   SpO2 98%   BMI 22.56 kg/m  Gen: awake, alert, appearing stated age Eyes: PERRLA, EOMi, no injection Heart: RRR Lungs: CTAB, no accessory muscle use Nose: No sinus ttp Neuro: CN2-12 grossly intact, fluent and goal-oriented speech, DTR's equal and symmetric in UE's and LE's MSK: 5/5 strength throughout, no TTP over TMJs bilaterally, cervical paraspinal musculature or occipital triangle region Psych: Age appropriate  judgment and insight, normal affect and mood  Acute nonintractable headache, unspecified headache type - Plan: ketorolac  (TORADOL ) injection 60 mg  Toradol  injection 60 mg IM.  Imitrex  100 mg daily as needed.  Sounds like migraines.  His dad does have them also.  He will let me know if he is having any issues with the medicine. F/u for a physical at his convenience. The pt voiced understanding and agreement to the plan.  Mabel Mt [place], DO 09/13/2024 3:26 PM
